# Patient Record
Sex: Male | Born: 1990 | Race: Black or African American | Hispanic: No | Marital: Single | Smoking: Current some day smoker
Health system: Southern US, Community
[De-identification: ages and names within clinical notes are randomized; demographics above are authoritative.]

## PROBLEM LIST (undated history)

## (undated) ENCOUNTER — Emergency Department (HOSPITAL_COMMUNITY): Admission: EM | Payer: Self-pay | Source: Home / Self Care

---
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
## 2016-06-10 ENCOUNTER — Encounter (HOSPITAL_COMMUNITY): Payer: Self-pay

## 2016-06-10 ENCOUNTER — Emergency Department (HOSPITAL_COMMUNITY)
Admission: EM | Admit: 2016-06-10 | Discharge: 2016-06-10 | Disposition: A | Discharge status: Home / Self Care | Payer: Self-pay | Attending: Emergency Medicine | Admitting: Emergency Medicine

## 2016-06-10 DIAGNOSIS — K0889 Other specified disorders of teeth and supporting structures: Secondary | ICD-10-CM

## 2016-06-10 DIAGNOSIS — F17210 Nicotine dependence, cigarettes, uncomplicated: Secondary | ICD-10-CM | POA: Insufficient documentation

## 2016-06-10 DIAGNOSIS — Z79899 Other long term (current) drug therapy: Secondary | ICD-10-CM | POA: Insufficient documentation

## 2016-06-10 MED ORDER — DICLOFENAC SODIUM 50 MG PO TBEC: 50.0000 mg | DELAYED_RELEASE_TABLET | Freq: Two times a day (BID) | ORAL | 0 refills | Status: AC

## 2016-06-10 NOTE — ED Triage Notes (Signed)
Patient c/o left and right upper and right lower dental pain. Patient has gum swelling. Patient states he has been taking Ibuprofen and Goody powders and now he is feeling nauseated.

## 2016-06-10 NOTE — ED Provider Notes (Signed)
[facility]-EMERGENCY DEPT Provider Note   CSN: [id number] Arrival date & time: 06/10/16  1401  By signing my name below, I, Rosario AdieWilliam Andrew Hiatt, attest that this documentation has been prepared under the direction and in the presence of Newell RubbermaidJeffrey Dewight Catino, PA-C.  Electronically Signed: Rosario AdieWilliam Andrew Hiatt, ED Scribe. 06/10/16. 4:54 PM.  History   Chief Complaint Chief Complaint  Patient presents with  . Dental Pain  . Nausea   The history is provided by the patient. No language interpreter was used.    HPI Comments:  Travis Miranda is a 26 y.o. male with no pertinent PMHx, who presents to the Emergency Department complaining of persistent, gradually worsening, right-sided, lower dental pain beginning several months ago, worsening acutely today prior to arrival. Pt describes their pain as throbbing and he notes associated swelling over the gingiva of the surrounding teeth. Per pt, several teeth over his area of pain have chipped off over the past several years. Pt has been taking Ibuprofen and Tylenol at home with minimal relief of his pain. Pt's pain is exacerbated with chewing. They are not currently followed by a dental specialist. Pt denies fever, chills, or any other associated symptoms.   History reviewed. No pertinent past medical history.  There are no active problems to display for this patient.  History reviewed. No pertinent surgical history.  Home Medications    Prior to Admission medications   Medication Sig Start Date End Date Taking? Authorizing Provider  diclofenac (VOLTAREN) 50 MG EC tablet Take 1 tablet (50 mg total) by mouth 2 (two) times daily. 06/10/16   Eyvonne MechanicHedges, Eliette Drumwright, PA-C   Family History Family History  Problem Relation Age of Onset  . Hypertension Mother   . Hypertension Father    Social History Social History  Substance Use Topics  . Smoking status: Current Some Day Smoker    Types: Cigarettes  . Smokeless tobacco: Never Used  . Alcohol use Yes   Comment: occasionally   Allergies   Patient has no known allergies.  Review of Systems Review of Systems  Constitutional: Negative for chills and fever.  HENT: Positive for dental problem and facial swelling.   All other systems reviewed and are negative.  Physical Exam Updated Vital Signs BP 113/73 (BP Location: Right Arm)   Pulse (!) 58   Temp 98.9 F (37.2 C) (Oral)   Resp 20   Ht 6' (1.829 m)   Wt 75.4 kg   SpO2 99%   BMI 22.55 kg/m   Physical Exam  Constitutional: He is oriented to person, place, and time. He appears well-developed and well-nourished. No distress.  HENT:  Head: Normocephalic.  Mouth/Throat: Uvula is midline, oropharynx is clear and moist and mucous membranes are normal. No oropharyngeal exudate, posterior oropharyngeal edema, posterior oropharyngeal erythema or tonsillar abscesses.  External exam shows no asymmetry of the jaw line or face, no signs of obvious swelling, edema, infection. Full active range of motion of the jaw. Neck is supple with full active range of motion, no tenderness to palpation of the soft tissues. Numerous dental caries and several missing teeth  Gumline palpated no obvious signs of infection including warmth, redness, abscess, tenderness. Posterior oropharynx clear with no signs of infection, uvula is midline and rises with phonation, tonsils present and normal in size, symmetrical bilateral, tongue is normal soft touch with full active range of motion, floor mouth is soft nontender.  Eyes: Conjunctivae are normal. Pupils are equal, round, and reactive to light. Right eye exhibits no  discharge. Left eye exhibits no discharge.  Neck: Normal range of motion. Neck supple. No JVD present. No tracheal deviation present. No thyromegaly present.  Pulmonary/Chest: No stridor.  Lymphadenopathy:    He has no cervical adenopathy.  Neurological: He is alert and oriented to person, place, and time.  Skin: Skin is warm and dry. No rash noted. He  is not diaphoretic. No erythema. No pallor.  Psychiatric: He has a normal mood and affect. His behavior is normal. Judgment and thought content normal.  Nursing note and vitals reviewed.  ED Treatments / Results  DIAGNOSTIC STUDIES: Oxygen Saturation is 99% on RA, normal by my interpretation.   COORDINATION OF CARE: 4:54 PM-Discussed next steps with pt. Pt verbalized understanding and is agreeable with the plan.   Labs (all labs ordered are listed, but only abnormal results are displayed) Labs Reviewed - No data to display  EKG  EKG Interpretation None      Radiology No results found.  Procedures Procedures   Medications Ordered in ED Medications - No data to display  Initial Impression / Assessment and Plan / ED Course  I have reviewed the triage vital signs and the nursing notes.  Pertinent labs & imaging results that were available during my care of the patient were reviewed by me and considered in my medical decision making (see chart for details).     26 year old male presents today with uncomfortable due to dental pain. Afebrile nontoxic in no acute distress. No signs of infection that would be amenable to I&D or need for antibiotic. Patient's pain is likely secondary to exposed nerves. Patient will be given follow-up information and strict return precautions. He verbalizes understanding and agreement to today's plan had no further questions or concerns at time of discharge  Final Clinical Impressions(s) / ED Diagnoses   Final diagnoses:  Pain, dental   New Prescriptions There are no discharge medications for this patient. I personally performed the services described in this documentation, which was scribed in my presence. The recorded information has been reviewed and is accurate.    Eyvonne Mechanic, PA-C 06/10/16 2008    Mesner, Barbara Cower, MD 06/11/16 [phone]

## 2016-06-10 NOTE — ED Notes (Signed)
Bed: [facility] Expected date:  Expected time:  Means of arrival:  Comments: 

## 2016-06-10 NOTE — Discharge Instructions (Signed)
Please read attached information. If you experience any new or worsening signs or symptoms please return to the emergency room for evaluation. Please follow-up with your primary care provider or specialist as discussed.  °

## 2018-09-07 ENCOUNTER — Other Ambulatory Visit: Payer: Self-pay

## 2018-09-07 DIAGNOSIS — Z20822 Contact with and (suspected) exposure to covid-19: Secondary | ICD-10-CM

## 2018-09-08 LAB — NOVEL CORONAVIRUS, NAA: SARS-CoV-2, NAA: NOT DETECTED

## 2018-12-23 ENCOUNTER — Other Ambulatory Visit: Payer: Self-pay

## 2018-12-23 ENCOUNTER — Encounter (HOSPITAL_COMMUNITY): Payer: Self-pay | Admitting: Emergency Medicine

## 2018-12-23 ENCOUNTER — Emergency Department (HOSPITAL_COMMUNITY)
Admission: EM | Admit: 2018-12-23 | Discharge: 2018-12-24 | Disposition: A | Discharge status: Home / Self Care | Payer: Self-pay | Attending: Emergency Medicine | Admitting: Emergency Medicine

## 2018-12-23 DIAGNOSIS — R1110 Vomiting, unspecified: Secondary | ICD-10-CM | POA: Insufficient documentation

## 2018-12-23 DIAGNOSIS — Z5321 Procedure and treatment not carried out due to patient leaving prior to being seen by health care provider: Secondary | ICD-10-CM | POA: Insufficient documentation

## 2018-12-23 LAB — COMPREHENSIVE METABOLIC PANEL
ALT: 23 U/L (ref 0–44)
AST: 30 U/L (ref 15–41)
Albumin: 4.2 g/dL (ref 3.5–5.0)
Alkaline Phosphatase: 77 U/L (ref 38–126)
Anion gap: 9 (ref 5–15)
BUN: 13 mg/dL (ref 6–20)
CO2: 22 mmol/L (ref 22–32)
Calcium: 9.5 mg/dL (ref 8.9–10.3)
Chloride: 105 mmol/L (ref 98–111)
Creatinine, Ser: 1.47 mg/dL — ABNORMAL HIGH (ref 0.61–1.24)
GFR calc Af Amer: >60 mL/min (ref >60)
GFR calc non Af Amer: >60 mL/min (ref >60)
Glucose, Bld: 100 mg/dL — ABNORMAL HIGH (ref 70–99)
Potassium: 3.6 mmol/L (ref 3.5–5.1)
Sodium: 136 mmol/L (ref 135–145)
Total Bilirubin: 2.7 mg/dL — ABNORMAL HIGH (ref 0.3–1.2)
Total Protein: 6.7 g/dL (ref 6.5–8.1)

## 2018-12-23 LAB — CBC
HCT: 41.3 % (ref 39.0–52.0)
Hemoglobin: 14.5 g/dL (ref 13.0–17.0)
MCH: 30.5 pg (ref 26.0–34.0)
MCHC: 35.1 g/dL (ref 30.0–36.0)
MCV: 86.8 fL (ref 80.0–100.0)
Platelets: 251 10*3/uL (ref 150–400)
RBC: 4.76 MIL/uL (ref 4.22–5.81)
RDW: 11 % — ABNORMAL LOW (ref 11.5–15.5)
WBC: 5.7 10*3/uL (ref 4.0–10.5)
nRBC: 0 % (ref 0.0–0.2)

## 2018-12-23 LAB — LIPASE, BLOOD: Lipase: 22 U/L (ref 11–51)

## 2018-12-23 NOTE — ED Notes (Signed)
Travis Miranda (girlfriend)- [phone]

## 2018-12-23 NOTE — ED Triage Notes (Signed)
Patient reports multiple emesis today with chills , body aches and fatigue .

## 2019-11-01 ENCOUNTER — Emergency Department (HOSPITAL_COMMUNITY): Payer: Self-pay

## 2019-11-01 ENCOUNTER — Emergency Department (HOSPITAL_COMMUNITY)
Admission: EM | Admit: 2019-11-01 | Discharge: 2019-11-01 | Disposition: A | Discharge status: Home / Self Care | Payer: Self-pay | Attending: Emergency Medicine | Admitting: Emergency Medicine

## 2019-11-01 ENCOUNTER — Encounter (HOSPITAL_COMMUNITY): Payer: Self-pay | Admitting: Emergency Medicine

## 2019-11-01 DIAGNOSIS — R930 Abnormal findings on diagnostic imaging of skull and head, not elsewhere classified: Secondary | ICD-10-CM | POA: Diagnosis not present

## 2019-11-01 DIAGNOSIS — Y9389 Activity, other specified: Secondary | ICD-10-CM | POA: Diagnosis not present

## 2019-11-01 DIAGNOSIS — M545 Low back pain, unspecified: Secondary | ICD-10-CM | POA: Diagnosis not present

## 2019-11-01 DIAGNOSIS — R1084 Generalized abdominal pain: Secondary | ICD-10-CM | POA: Diagnosis present

## 2019-11-01 DIAGNOSIS — Y92410 Unspecified street and highway as the place of occurrence of the external cause: Secondary | ICD-10-CM | POA: Insufficient documentation

## 2019-11-01 LAB — I-STAT CHEM 8, ED
BUN: 12 mg/dL (ref 6–20)
Calcium, Ion: 1.2 mmol/L (ref 1.15–1.40)
Chloride: 103 mmol/L (ref 98–111)
Creatinine, Ser: 1 mg/dL (ref 0.61–1.24)
Glucose, Bld: 95 mg/dL (ref 70–99)
HCT: 43 % (ref 39.0–52.0)
Hemoglobin: 14.6 g/dL (ref 13.0–17.0)
Potassium: 3.9 mmol/L (ref 3.5–5.1)
Sodium: 140 mmol/L (ref 135–145)
TCO2: 24 mmol/L (ref 22–32)

## 2019-11-01 LAB — CBG MONITORING, ED: Glucose-Capillary: 93 mg/dL (ref 70–99)

## 2019-11-01 LAB — URINALYSIS, ROUTINE W REFLEX MICROSCOPIC
Bilirubin Urine: NEGATIVE
Glucose, UA: NEGATIVE mg/dL
Ketones, ur: NEGATIVE mg/dL
Leukocytes,Ua: NEGATIVE
Nitrite: NEGATIVE
Protein, ur: NEGATIVE mg/dL
Specific Gravity, Urine: 1.014 (ref 1.005–1.030)
pH: 8 (ref 5.0–8.0)

## 2019-11-01 LAB — COMPREHENSIVE METABOLIC PANEL
ALT: 18 U/L (ref 0–44)
AST: 27 U/L (ref 15–41)
Albumin: 4.1 g/dL (ref 3.5–5.0)
Alkaline Phosphatase: 81 U/L (ref 38–126)
Anion gap: 10 (ref 5–15)
BUN: 12 mg/dL (ref 6–20)
CO2: 23 mmol/L (ref 22–32)
Calcium: 9.7 mg/dL (ref 8.9–10.3)
Chloride: 105 mmol/L (ref 98–111)
Creatinine, Ser: 1.03 mg/dL (ref 0.61–1.24)
GFR calc non Af Amer: >60 mL/min (ref >60)
Glucose, Bld: 93 mg/dL (ref 70–99)
Potassium: 3.9 mmol/L (ref 3.5–5.1)
Sodium: 138 mmol/L (ref 135–145)
Total Bilirubin: 1.4 mg/dL — ABNORMAL HIGH (ref 0.3–1.2)
Total Protein: 6.9 g/dL (ref 6.5–8.1)

## 2019-11-01 LAB — CBC
HCT: 43.2 % (ref 39.0–52.0)
Hemoglobin: 14.5 g/dL (ref 13.0–17.0)
MCH: 29.4 pg (ref 26.0–34.0)
MCHC: 33.6 g/dL (ref 30.0–36.0)
MCV: 87.6 fL (ref 80.0–100.0)
Platelets: 237 10*3/uL (ref 150–400)
RBC: 4.93 MIL/uL (ref 4.22–5.81)
RDW: 11.2 % — ABNORMAL LOW (ref 11.5–15.5)
WBC: 3.6 10*3/uL — ABNORMAL LOW (ref 4.0–10.5)
nRBC: 0 % (ref 0.0–0.2)

## 2019-11-01 LAB — SAMPLE TO BLOOD BANK

## 2019-11-01 LAB — PROTIME-INR
INR: 1.1 (ref 0.8–1.2)
Prothrombin Time: 13.8 seconds (ref 11.4–15.2)

## 2019-11-01 LAB — ETHANOL: Alcohol, Ethyl (B): <10 mg/dL (ref <10)

## 2019-11-01 LAB — LACTIC ACID, PLASMA: Lactic Acid, Venous: 1.8 mmol/L (ref 0.5–1.9)

## 2019-11-01 MED ORDER — IOHEXOL 300 MG/ML  SOLN
100.0000 mL | Freq: Once | INTRAMUSCULAR | Status: AC | PRN
  Administered 2019-11-01: 100 mL via INTRAVENOUS
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
## 2019-11-01 NOTE — ED Notes (Addendum)
Travis Miranda, pt's contact notified at [phone].
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
## 2019-11-01 NOTE — Discharge Instructions (Signed)
Please return for any problem.  Follow-up with your regular care provider as instructed.  Use ibuprofen -600 mg taken every 8 hours -for treatment of pain.

## 2019-11-01 NOTE — ED Notes (Signed)
RN removed two IVs w/ no issues.  Pt and visitor was able to verbally review what provider told him.  RN reviewed discharge instructions and gave work note as well.  No questions at this time.  Pt was able to walk out of ED w/o assistance.

## 2019-11-03 ENCOUNTER — Encounter (HOSPITAL_COMMUNITY): Payer: Self-pay | Admitting: Emergency Medicine

## 2020-08-09 ENCOUNTER — Emergency Department (HOSPITAL_BASED_OUTPATIENT_CLINIC_OR_DEPARTMENT_OTHER)
Admission: EM | Admit: 2020-08-09 | Discharge: 2020-08-09 | Disposition: A | Discharge status: Home / Self Care | Payer: Self-pay | Attending: Emergency Medicine | Admitting: Emergency Medicine

## 2020-08-09 ENCOUNTER — Encounter (HOSPITAL_BASED_OUTPATIENT_CLINIC_OR_DEPARTMENT_OTHER): Payer: Self-pay | Admitting: *Deleted

## 2020-08-09 ENCOUNTER — Other Ambulatory Visit: Payer: Self-pay

## 2020-08-09 DIAGNOSIS — K649 Unspecified hemorrhoids: Secondary | ICD-10-CM | POA: Insufficient documentation

## 2020-08-09 DIAGNOSIS — Z5321 Procedure and treatment not carried out due to patient leaving prior to being seen by health care provider: Secondary | ICD-10-CM | POA: Insufficient documentation

## 2020-08-09 NOTE — ED Triage Notes (Signed)
/  o rectal pain and bleeding Hemorid x 1 week
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
## 2021-09-01 ENCOUNTER — Emergency Department (HOSPITAL_BASED_OUTPATIENT_CLINIC_OR_DEPARTMENT_OTHER)
Admission: EM | Admit: 2021-09-01 | Discharge: 2021-09-01 | Disposition: A | Discharge status: Home / Self Care | Payer: BC Managed Care – PPO | Attending: Emergency Medicine | Admitting: Emergency Medicine

## 2021-09-01 ENCOUNTER — Encounter (HOSPITAL_BASED_OUTPATIENT_CLINIC_OR_DEPARTMENT_OTHER): Payer: Self-pay | Admitting: Emergency Medicine

## 2021-09-01 ENCOUNTER — Other Ambulatory Visit: Payer: Self-pay

## 2021-09-01 DIAGNOSIS — K029 Dental caries, unspecified: Secondary | ICD-10-CM | POA: Diagnosis not present

## 2021-09-01 DIAGNOSIS — K0889 Other specified disorders of teeth and supporting structures: Secondary | ICD-10-CM | POA: Diagnosis present

## 2021-09-01 MED ORDER — IBUPROFEN 800 MG PO TABS: 800.0000 mg | ORAL_TABLET | Freq: Three times a day (TID) | ORAL | 0 refills | Status: DC | PRN

## 2021-09-01 MED ORDER — PENICILLIN V POTASSIUM 500 MG PO TABS: 500.0000 mg | ORAL_TABLET | Freq: Four times a day (QID) | ORAL | 0 refills | Status: DC

## 2021-09-01 MED ORDER — PENICILLIN V POTASSIUM 500 MG PO TABS: 500.0000 mg | ORAL_TABLET | Freq: Four times a day (QID) | ORAL | 0 refills | Status: AC

## 2021-09-01 MED ORDER — KETOROLAC TROMETHAMINE 30 MG/ML IJ SOLN
60.0000 mg | Freq: Once | INTRAMUSCULAR | Status: AC
Start: 2021-09-01 — End: 2021-09-01
  Administered 2021-09-01: 60 mg via INTRAMUSCULAR
  Filled 2021-09-01: qty 2

## 2021-09-01 MED ORDER — IBUPROFEN 800 MG PO TABS: 800.0000 mg | ORAL_TABLET | Freq: Three times a day (TID) | ORAL | 0 refills | Status: AC | PRN

## 2021-09-01 MED ORDER — OXYCODONE-ACETAMINOPHEN 5-325 MG PO TABS
1.0000 | ORAL_TABLET | Freq: Once | ORAL | Status: AC
  Administered 2021-09-01: 1 via ORAL
  Filled 2021-09-01: qty 1

## 2021-09-01 NOTE — ED Triage Notes (Signed)
Pt arrives pov, c/o left side upper and lower dental pain upon waking today. Denies CP, denies fever. Pt reports "left side of face hurts". Pt endorses 68ml morphine pta, unk mg dosage

## 2021-09-01 NOTE — Discharge Instructions (Signed)
You are seen in the emergency department for dental pain.  We are prescribing antibiotics and anti-inflammatories.  Please limit tobacco and use warm salt water rinses.  Follow-up with a dentist as soon as possible.

## 2021-09-01 NOTE — ED Provider Notes (Signed)
[facility] EMERGENCY DEPARTMENT Provider Note   CSN: [id number] Arrival date & time: 09/01/21  1007     History  Chief Complaint  Patient presents with   Dental Pain    Travis Miranda is a 31 y.o. male.  He has no significant past medical history.  Complaining of left upper and lower tooth pain that started this morning.  He said they have given him problems before he knew he had needed to have them extracted.  Pain with chewing and swallowing although no difficulty swallowing.  Has some numbness on his face due to the pain.  Took some oral morphine leftover from his girlfriend's relative with some improvement in his pain, currently rates it as 7 out of 10.  Does have a dental appointment tomorrow.  No fevers.  The history is provided by the patient.  Dental Pain Location:  Upper and lower Upper teeth location:  14/LU 1st molar Lower teeth location:  19/LL 1st molar Quality:  Throbbing Severity:  Severe Onset quality:  Sudden Duration:  4 hours Timing:  Constant Chronicity:  Recurrent Context: dental fracture   Worsened by:  Jaw movement and touching Ineffective treatments: oral narcotics. Associated symptoms: facial pain and headaches   Associated symptoms: no difficulty swallowing, no facial swelling, no fever and no trismus   Risk factors: lack of dental care and smoking        Home Medications Prior to Admission medications   Medication Sig Start Date End Date Taking? Authorizing Provider  amoxicillin (AMOXIL) 500 MG capsule Take 500 mg by mouth 2 (two) times daily. Started 2 weeks before for mouth issue.    [provider]  diclofenac (VOLTAREN) 50 MG EC tablet Take 1 tablet (50 mg total) by mouth 2 (two) times daily. 06/10/16   Eyvonne Mechanic, PA-C      Allergies    Patient has no known allergies.    Review of Systems   Review of Systems  Constitutional:  Negative for fever.  HENT:  Negative for facial swelling.   Neurological:   Positive for headaches.    Physical Exam Updated Vital Signs BP 121/84 (BP Location: Left Arm)   Pulse 60   Temp 98.2 F (36.8 C) (Oral)   Resp 18   Ht 6' (1.829 m)   Wt 78 kg   SpO2 97%   BMI 23.33 kg/m  Physical Exam Vitals and nursing note reviewed.  Constitutional:      Appearance: Normal appearance. He is well-developed.  HENT:     Head: Normocephalic and atraumatic.     Nose: Nose normal.     Mouth/Throat:     Mouth: Mucous membranes are moist.     Pharynx: Oropharynx is clear.     Comments: He has fair dentition with multiple prior extractions.  There are some gingival erythema, no definite abscess.  Multiple teeth have percussion tenderness.  No trismus. Eyes:     Conjunctiva/sclera: Conjunctivae normal.  Pulmonary:     Effort: Pulmonary effort is normal.  Musculoskeletal:     Cervical back: Neck supple. No tenderness.  Skin:    General: Skin is warm and dry.  Neurological:     Mental Status: He is alert.     GCS: GCS eye subscore is 4. GCS verbal subscore is 5. GCS motor subscore is 6.     ED Results / Procedures / Treatments   Labs (all labs ordered are listed, but only abnormal results are displayed) Labs Reviewed - No  data to display  EKG None  Radiology No results found.  Procedures Procedures    Medications Ordered in ED Medications  ketorolac (TORADOL) 30 MG/ML injection 60 mg (has no administration in time range)  oxyCODONE-acetaminophen (PERCOCET/ROXICET) 5-325 MG per tablet 1 tablet (has no administration in time range)    ED Course/ Medical Decision Making/ A&P                           Medical Decision Making Risk Prescription drug management.  Differential includes dental pain, dental abscess, Ludewig's angina.  Exam is very benign appearing with no facial swelling no trismus normal voice.  Will cover with antibiotics and put on NSAIDs.  Has a dentist follow-up appointment tomorrow.  Return instructions  discussed        Final Clinical Impression(s) / ED Diagnoses Final diagnoses:  Pain due to dental caries    Rx / DC Orders ED Discharge Orders          Ordered    penicillin v potassium (VEETID) 500 MG tablet  4 times daily,   Status:  Discontinued        09/01/21 1035    ibuprofen (ADVIL) 800 MG tablet  Every 8 hours PRN,   Status:  Discontinued        09/01/21 1035    ibuprofen (ADVIL) 800 MG tablet  Every 8 hours PRN        09/01/21 1117    penicillin v potassium (VEETID) 500 MG tablet  4 times daily        09/01/21 1117              Terrilee Files, MD 09/01/21 1701

## 2023-03-04 ENCOUNTER — Other Ambulatory Visit: Payer: Self-pay | Admitting: Medical Genetics

## 2023-03-20 ENCOUNTER — Other Ambulatory Visit (HOSPITAL_COMMUNITY): Payer: Self-pay | Attending: Medical Genetics

## 2023-08-04 ENCOUNTER — Other Ambulatory Visit: Payer: Self-pay

## 2023-08-04 ENCOUNTER — Ambulatory Visit
Admission: RE | Admit: 2023-08-04 | Discharge: 2023-08-04 | Disposition: A | Discharge status: Home / Self Care | Payer: Self-pay | Source: Ambulatory Visit | Attending: Physician Assistant | Admitting: Physician Assistant

## 2023-08-04 VITALS — BP 121/83 | HR 83 | Temp 98.5°F | Resp 18 | Ht 72.0 in | Wt 165.0 lb

## 2023-08-04 DIAGNOSIS — H66001 Acute suppurative otitis media without spontaneous rupture of ear drum, right ear: Secondary | ICD-10-CM

## 2023-08-04 MED ORDER — AMOXICILLIN 500 MG PO CAPS: 500.0000 mg | ORAL_CAPSULE | Freq: Two times a day (BID) | ORAL | 0 refills | Status: AC

## 2023-08-04 NOTE — ED Triage Notes (Addendum)
 Pt presents with complaints of muffled hearing in right ear x 3 days. Currently rates overall ear pain a 4/10. Describes as sharp pains that come and go. Loud ringing in right ear. Cough and congestion also mentioned. Two pills of leftover Amoxicillin  taken on Sunday/Monday with improvement. Symptoms/pain are now returning.

## 2023-08-04 NOTE — ED Provider Notes (Signed)
 [facility]    CSN: [id number] Arrival date & time: 08/04/23  1731      History   Chief Complaint Chief Complaint  Patient presents with   Ear Fullness    My 33yr old got sick, got an ear infection. Next thing I know I'm sick, throat hurts, mucus & slight runny nose. Then I could hear a loud run in my ear for about 2 1/2 hours straight. I looked it up and it said that can be caused from ear infections. - Entered by patient    HPI Travis Miranda is a 33 y.o. male.   HPI Pt reports concerns for right ear pain and muffled hearing since Sunday  He denies fever, chills, ear drainage  He also reports some coughing and postnasal drainage Interventions: He took two leftover Amoxicillin  pills which seemed to help but symptoms returned quickly   History reviewed. No pertinent past medical history.  There are no active problems to display for this patient.   History reviewed. No pertinent surgical history.     Home Medications    Prior to Admission medications   Medication Sig Start Date End Date Taking? Authorizing Provider  amoxicillin  (AMOXIL ) 500 MG capsule Take 1 capsule (500 mg total) by mouth 2 (two) times daily for 7 days. 08/04/23 08/11/23 Yes Muzammil Bruins E, PA-C  diclofenac  (VOLTAREN ) 50 MG EC tablet Take 1 tablet (50 mg total) by mouth 2 (two) times daily. 06/10/16   Hedges, Reyes, PA-C  ibuprofen  (ADVIL ) 800 MG tablet Take 1 tablet (800 mg total) by mouth every 8 (eight) hours as needed. 09/01/21   Towana Ozell BROCKS, MD    Family History Family History  Problem Relation Age of Onset   Hypertension Mother    Hypertension Father     Social History Social History   Tobacco Use   Smoking status: Some Days    Types: Cigarettes   Smokeless tobacco: Never  Vaping Use   Vaping status: Never Used  Substance Use Topics   Alcohol use: Yes    Comment: occasionally   Drug use: Yes    Types: Marijuana    Comment: occasinally     Allergies   Patient  has no known allergies.   Review of Systems Review of Systems  Constitutional:  Negative for chills, diaphoresis and fever.  HENT:  Positive for congestion, ear pain, rhinorrhea and sore throat. Negative for ear discharge.   Respiratory:  Positive for cough. Negative for shortness of breath and wheezing.   Gastrointestinal:  Negative for diarrhea, nausea and vomiting.     Physical Exam Triage Vital Signs ED Triage Vitals  Encounter Vitals Group     BP 08/04/23 1802 121/83     Girls Systolic BP Percentile --      Girls Diastolic BP Percentile --      Boys Systolic BP Percentile --      Boys Diastolic BP Percentile --      Pulse Rate 08/04/23 1802 83     Resp 08/04/23 1802 18     Temp 08/04/23 1802 98.5 F (36.9 C)     Temp Source 08/04/23 1802 Oral     SpO2 08/04/23 1802 95 %     Weight 08/04/23 1807 165 lb (74.8 kg)     Height 08/04/23 1807 6' (1.829 m)     Head Circumference --      Peak Flow --      Pain Score 08/04/23 1807 4  Pain Loc --      Pain Education --      Exclude from Growth Chart --    No data found.  Updated Vital Signs BP 121/83 (BP Location: Right Arm)   Pulse 83   Temp 98.5 F (36.9 C) (Oral)   Resp 18   Ht 6' (1.829 m)   Wt 165 lb (74.8 kg)   SpO2 95%   BMI 22.38 kg/m   Visual Acuity Right Eye Distance:   Left Eye Distance:   Bilateral Distance:    Right Eye Near:   Left Eye Near:    Bilateral Near:     Physical Exam Vitals reviewed.  Constitutional:      General: He is awake.     Appearance: Normal appearance. He is well-developed and well-groomed.  HENT:     Head: Normocephalic and atraumatic.     Right Ear: Hearing and ear canal normal. Tympanic membrane is injected, erythematous and bulging. Tympanic membrane is not scarred, perforated or retracted.     Left Ear: Hearing, tympanic membrane and ear canal normal. Tympanic membrane is not injected, scarred, perforated, erythematous, retracted or bulging.     Mouth/Throat:      Lips: Pink.     Mouth: Mucous membranes are moist.     Pharynx: Oropharynx is clear. Uvula midline. No pharyngeal swelling, oropharyngeal exudate, posterior oropharyngeal erythema, uvula swelling or postnasal drip.     Tonsils: No tonsillar exudate or tonsillar abscesses.  Eyes:     Extraocular Movements: Extraocular movements intact.     Conjunctiva/sclera: Conjunctivae normal.  Pulmonary:     Effort: Pulmonary effort is normal.  Musculoskeletal:     Cervical back: Normal range of motion.  Neurological:     Mental Status: He is alert and oriented to person, place, and time.  Psychiatric:        Attention and Perception: Attention normal.        Mood and Affect: Mood normal.        Speech: Speech normal.        Behavior: Behavior normal. Behavior is cooperative.      UC Treatments / Results  Labs (all labs ordered are listed, but only abnormal results are displayed) Labs Reviewed - No data to display  EKG   Radiology No results found.  Procedures Procedures (including critical care time)  Medications Ordered in UC Medications - No data to display  Initial Impression / Assessment and Plan / UC Course  I have reviewed the triage vital signs and the nursing notes.  Pertinent labs & imaging results that were available during my care of the patient were reviewed by me and considered in my medical decision making (see chart for details).      Final Clinical Impressions(s) / UC Diagnoses   Final diagnoses:  Acute suppurative otitis media of right ear without spontaneous rupture of tympanic membrane, recurrence not specified   Patient presents today with concerns for right-sided ear pain started on Sunday.  He denies fever, chills, ear swelling or drainage.  He does note some sore throat as well as muffled hearing, nasal congestion, runny nose.  Physical exam is notable for erythematous, bulging tympanic membrane on the right side consistent with otitis media.  Will start  amoxicillin  500 mg p.o. twice daily x 7 days for management.  Recommend over-the-counter acetaminophen  and as needed for pain and fever control.  ED and return precautions reviewed and provided in after visit summary.  Follow-up as needed.  Discharge Instructions      You were seen today for concerns of right ear pain and muffled hearing Your physical exam appears consistent with an ear infection I have sent in a script for an antibiotic called amoxicillin  500 mg for you to take by mouth twice per day for 7 days.  Please finish the entire course of the medication as directed unless you are instructed to stop by medical provider or if you develop an allergic reaction. If needed you can alternate Tylenol  and ibuprofen  to assist with fever and pain control. If you notice any of the following please return to urgent care or go to the emergency room for further evaluation: Swelling around the ear, drainage from the ear, fever or chills are not responding to medications, severe headache, severe ear pain.     ED Prescriptions     Medication Sig Dispense Auth. Provider   amoxicillin  (AMOXIL ) 500 MG capsule Take 1 capsule (500 mg total) by mouth 2 (two) times daily for 7 days. 14 capsule Arlyne Brandes E, PA-C      PDMP not reviewed this encounter.   Marylene Rocky BRAVO, PA-C 08/04/23 1839

## 2023-08-04 NOTE — Discharge Instructions (Addendum)
 You were seen today for concerns of right ear pain and muffled hearing Your physical exam appears consistent with an ear infection I have sent in a script for an antibiotic called amoxicillin  500 mg for you to take by mouth twice per day for 7 days.  Please finish the entire course of the medication as directed unless you are instructed to stop by medical provider or if you develop an allergic reaction. If needed you can alternate Tylenol  and ibuprofen  to assist with fever and pain control. If you notice any of the following please return to urgent care or go to the emergency room for further evaluation: Swelling around the ear, drainage from the ear, fever or chills are not responding to medications, severe headache, severe ear pain.
